# Patient Record
Sex: Female | Born: 1980 | Race: White | Hispanic: No | Marital: Single | Smoking: Current every day smoker
Health system: Southern US, Community
[De-identification: ages and names within clinical notes are randomized; demographics above are authoritative.]

## PROBLEM LIST (undated history)

## (undated) DIAGNOSIS — N289 Disorder of kidney and ureter, unspecified: Secondary | ICD-10-CM

## (undated) DIAGNOSIS — B977 Papillomavirus as the cause of diseases classified elsewhere: Secondary | ICD-10-CM

---

## 2008-11-08 ENCOUNTER — Emergency Department (HOSPITAL_COMMUNITY): Admission: EM | Admit: 2008-11-08 | Discharge: 2008-11-08 | Payer: Self-pay | Admitting: Emergency Medicine

## 2008-11-29 ENCOUNTER — Other Ambulatory Visit: Admission: RE | Admit: 2008-11-29 | Discharge: 2008-11-29 | Payer: Self-pay | Admitting: Obstetrics and Gynecology

## 2009-04-08 ENCOUNTER — Encounter (HOSPITAL_COMMUNITY): Admission: RE | Admit: 2009-04-08 | Discharge: 2009-05-08 | Payer: Self-pay | Admitting: Obstetrics and Gynecology

## 2009-05-22 ENCOUNTER — Inpatient Hospital Stay (HOSPITAL_COMMUNITY): Admission: AD | Admit: 2009-05-22 | Discharge: 2009-05-22 | Payer: Self-pay | Admitting: Obstetrics & Gynecology

## 2009-05-22 ENCOUNTER — Ambulatory Visit: Payer: Self-pay | Admitting: Obstetrics and Gynecology

## 2009-05-28 ENCOUNTER — Inpatient Hospital Stay (HOSPITAL_COMMUNITY): Admission: AD | Admit: 2009-05-28 | Discharge: 2009-05-29 | Payer: Self-pay | Admitting: Obstetrics & Gynecology

## 2009-06-01 ENCOUNTER — Ambulatory Visit: Payer: Self-pay | Admitting: Obstetrics & Gynecology

## 2009-06-01 ENCOUNTER — Inpatient Hospital Stay (HOSPITAL_COMMUNITY): Admission: AD | Admit: 2009-06-01 | Discharge: 2009-06-04 | Payer: Self-pay | Admitting: Obstetrics & Gynecology

## 2009-06-01 HISTORY — PX: TUBAL LIGATION: SHX77

## 2009-12-28 ENCOUNTER — Other Ambulatory Visit (HOSPITAL_COMMUNITY): Payer: Self-pay | Admitting: Emergency Medicine

## 2009-12-28 ENCOUNTER — Other Ambulatory Visit (HOSPITAL_COMMUNITY): Payer: Self-pay | Admitting: Psychiatry

## 2009-12-28 ENCOUNTER — Ambulatory Visit: Payer: Self-pay | Admitting: Psychiatry

## 2009-12-28 ENCOUNTER — Inpatient Hospital Stay (HOSPITAL_COMMUNITY): Admission: AD | Admit: 2009-12-28 | Discharge: 2010-01-04 | Payer: Self-pay | Admitting: Psychiatry

## 2010-02-03 ENCOUNTER — Emergency Department (HOSPITAL_COMMUNITY): Admission: EM | Admit: 2010-02-03 | Discharge: 2010-02-03 | Payer: Self-pay | Admitting: Emergency Medicine

## 2011-03-11 LAB — URINALYSIS, ROUTINE W REFLEX MICROSCOPIC
Glucose, UA: NEGATIVE mg/dL
Nitrite: NEGATIVE
Protein, ur: NEGATIVE mg/dL
Specific Gravity, Urine: 1.021 (ref 1.005–1.030)
pH: 6.5 (ref 5.0–8.0)

## 2011-03-11 LAB — ETHANOL: Alcohol, Ethyl (B): <5 mg/dL (ref 0–10)

## 2011-03-11 LAB — RAPID URINE DRUG SCREEN, HOSP PERFORMED
Amphetamines: NOT DETECTED
Barbiturates: NOT DETECTED
Benzodiazepines: POSITIVE — AB
Tetrahydrocannabinol: NOT DETECTED

## 2011-03-11 LAB — POCT I-STAT, CHEM 8
Calcium, Ion: 1.21 mmol/L (ref 1.12–1.32)
Chloride: 107 mEq/L (ref 96–112)
Creatinine, Ser: 0.8 mg/dL (ref 0.4–1.2)

## 2011-03-11 LAB — DIFFERENTIAL
Basophils Absolute: 0 10*3/uL (ref 0.0–0.1)
Eosinophils Relative: 1 % (ref 0–5)
Lymphs Abs: 1.7 10*3/uL (ref 0.7–4.0)
Monocytes Absolute: 0.5 10*3/uL (ref 0.1–1.0)
Monocytes Relative: 6 % (ref 3–12)
Neutro Abs: 6 10*3/uL (ref 1.7–7.7)
Neutrophils Relative %: 72 % (ref 43–77)

## 2011-03-11 LAB — CBC
Platelets: 266 10*3/uL (ref 150–400)
RBC: 4.52 MIL/uL (ref 3.87–5.11)
RDW: 16.2 % — ABNORMAL HIGH (ref 11.5–15.5)

## 2011-03-11 LAB — SALICYLATE LEVEL: Salicylate Lvl: <4 mg/dL (ref 2.8–20.0)

## 2011-03-14 LAB — CBC
Hemoglobin: 12.6 g/dL (ref 12.0–15.0)
MCHC: 34.1 g/dL (ref 30.0–36.0)

## 2011-03-14 LAB — COMPREHENSIVE METABOLIC PANEL
ALT: 29 U/L (ref 0–35)
Albumin: 4.1 g/dL (ref 3.5–5.2)
Alkaline Phosphatase: 58 U/L (ref 39–117)
CO2: 24 mEq/L (ref 19–32)
Creatinine, Ser: 0.62 mg/dL (ref 0.4–1.2)
GFR calc non Af Amer: >60 mL/min (ref >60)
Glucose, Bld: 83 mg/dL (ref 70–99)

## 2011-03-14 LAB — URINALYSIS, ROUTINE W REFLEX MICROSCOPIC
Bilirubin Urine: NEGATIVE
Ketones, ur: NEGATIVE mg/dL
Leukocytes, UA: NEGATIVE
Specific Gravity, Urine: <1.005 — ABNORMAL LOW (ref 1.005–1.030)

## 2011-03-14 LAB — RAPID URINE DRUG SCREEN, HOSP PERFORMED
Amphetamines: NOT DETECTED
Barbiturates: NOT DETECTED
Benzodiazepines: POSITIVE — AB
Cocaine: NOT DETECTED
Opiates: NOT DETECTED

## 2011-03-14 LAB — URINE MICROSCOPIC-ADD ON

## 2011-03-14 LAB — ETHANOL: Alcohol, Ethyl (B): <5 mg/dL (ref 0–10)

## 2011-04-02 LAB — RPR: RPR Ser Ql: NONREACTIVE

## 2011-04-02 LAB — CBC
HCT: 26.3 % — ABNORMAL LOW (ref 36.0–46.0)
HCT: 35.3 % — ABNORMAL LOW (ref 36.0–46.0)
MCHC: 34.7 g/dL (ref 30.0–36.0)
MCV: 87.4 fL (ref 78.0–100.0)
Platelets: 182 10*3/uL (ref 150–400)
RBC: 2.97 MIL/uL — ABNORMAL LOW (ref 3.87–5.11)
RDW: 13.8 % (ref 11.5–15.5)
WBC: 15.6 10*3/uL — ABNORMAL HIGH (ref 4.0–10.5)
WBC: 16.1 10*3/uL — ABNORMAL HIGH (ref 4.0–10.5)

## 2011-04-02 LAB — COMPREHENSIVE METABOLIC PANEL
ALT: 16 U/L (ref 0–35)
AST: 29 U/L (ref 0–37)
Albumin: 2.8 g/dL — ABNORMAL LOW (ref 3.5–5.2)
Alkaline Phosphatase: 231 U/L — ABNORMAL HIGH (ref 39–117)
CO2: 20 mEq/L (ref 19–32)
Chloride: 109 mEq/L (ref 96–112)
Creatinine, Ser: 0.57 mg/dL (ref 0.4–1.2)
GFR calc non Af Amer: >60 mL/min (ref >60)
Potassium: 3.9 mEq/L (ref 3.5–5.1)
Sodium: 136 mEq/L (ref 135–145)
Total Protein: 5.6 g/dL — ABNORMAL LOW (ref 6.0–8.3)

## 2011-04-02 LAB — URINALYSIS, ROUTINE W REFLEX MICROSCOPIC
Glucose, UA: NEGATIVE mg/dL
Ketones, ur: 15 mg/dL — AB
Nitrite: NEGATIVE
Protein, ur: NEGATIVE mg/dL
Specific Gravity, Urine: 1.015 (ref 1.005–1.030)
Urobilinogen, UA: 0.2 mg/dL (ref 0.0–1.0)

## 2011-04-09 ENCOUNTER — Ambulatory Visit: Payer: Self-pay | Admitting: Physical Therapy

## 2011-05-08 NOTE — Op Note (Signed)
Michaela Schneider, Michaela Schneider               ACCOUNT NO.:  [id number]   MEDICAL RECORD NO.:  [id number]          PATIENT TYPE:  INP   LOCATION:  9106                          [facility]ILITY:  [facility]   PHYSICIAN:  [name], MD       DATE OF BIRTH:  1981-06-13   DATE OF PROCEDURE:  06/01/2009  DATE OF DISCHARGE:                               OPERATIVE REPORT   PROCEDURE:  Repeat low-transverse cesarean section with bilateral tubal  ligation.   PREOPERATIVE DIAGNOSES:  1. Intrauterine pregnancy [redacted] weeks gestation in labor.  2. Previous cesarean section.  3. Undesired fertility.   POSTOPERATIVE DIAGNOSES:  1. Intrauterine pregnancy delivered.  2. Permanent sterilization.   SURGEON:  [name], MD   ANESTHESIA:  Spinal.   ASSISTANT:  Cat Ta, MD   ESTIMATED BLOOD LOSS:  600 mL.   SPECIMEN:  None.   COMPLICATIONS:  None.   DRAINS:  Foley catheter.   COUNTS:  Correct.   FINDINGS:  Live born female infant 8 pounds 12 ounces.  Apgars 8 and 9.  The patient gave consent for repeat cesarean section, bilateral tubal  ligation at 37 weeks fixation gestation and labor.  The patient  identification was confirmed.  She was brought to the operating room and  spinal anesthesia was induced.  She was placed in dorsal supine position  left lateral tilt.  Abdomen and perineum was sterilely prepped and  draped and a Foley catheter was placed.  A #10 blade was used to make  Pfannenstiel incision and was carried down previous cesarean section  scars.  Incision was carried out to the fascia and the fascia was  incised and the incision was extended transversely with curved Mayo  scissors.  Fascia was separated from underlying tissue attachments with  blunt and sharp dissection.  Bellies of the rectus muscles was  separated.  Peritoneum was elevated, incised with Metzenbaum scissors  and the incision was extended vertically.  Bladder blade was inserted.  Vesicouterine fold was incised and the incision was  carried transversely  and bladder flap was created.  Bladder blade was reinserted.  Lower  uterine segment was entered at the midline with #10 blade.  Meconium-  stained fluid was expressed.  Incision was extended transversely and  fetal head was elevated and delivered.  Mouth and nose were cleared with  bulb suction.  The infant was delivered atraumatically.  That was  vigorous at birth and was live born female 8 pounds 12 ounces, Apgars 8  and 9.  Cord was clamped and cut and the infant was handed to nursery  personnel.  The placenta was removed manually and uterine cavity was  explored.  The patient received IV Pitocin.  She had received IV Kefzol  prior to incision.  The uterine incision was closed with a running  locking suture of 0 Vicryl.  A imbricating layer followed with 0 Vicryl  and good hemostasis was seen.  Pelvic gutters were irrigated.  The left  and right fallopian tubes and ovaries were identified and appeared  normal.  Right fallopian tube was elevated and the  Filshie clip was  placed just below the midportion of the tube with good positioning seen.  The same was done on the left side.  The anterior peritoneum was closed  with a running suture of 2-0 Vicryl.  The fascia was closed with a  running suture of 0 Vicryl.  Good hemostasis was assured in the incision  and the incision was irrigated.  Skin was closed with a running  subcuticular suture with 4-0 Vicryl.  Sterile dressing was applied.  The  patient tolerated the procedure well without complication.  Estimated  blood loss 600 mL.  She was brought in stable condition to recovery  room.      [name], MD  Electronically Signed     JA/MEDQ  D:  06/01/2009  T:  06/02/2009  Job:  [id number]

## 2011-05-08 NOTE — Discharge Summary (Signed)
Michaela Schneider, Michaela Schneider               ACCOUNT NO.:  [id number]   MEDICAL RECORD NO.:  [id number]          PATIENT TYPE:  INP   LOCATION:  9106                          [facility]   PHYSICIAN:  Lesly Dukes, M.D. DATE OF BIRTH:  June 19, 1981   DATE OF ADMISSION:  06/01/2009  DATE OF DISCHARGE:  06/04/2009                               DISCHARGE SUMMARY   Michaela Schneider is a gravida 4, para 1-1-1-2, who was admitted in labor at 40  1/[redacted] weeks gestation for a repeat low transverse cesarean section.   ADMITTING DIAGNOSES:  1. Intrauterine pregnancy at 37 weeks.  2. Previous cesarean section x2.  3. Undesired fertility.   DISCHARGE DIAGNOSES:  1. Intrauterine pregnancy at 37 weeks.  2. Previous cesarean section x2.  3. Undesired fertility.   PROCEDURES:  1. Repeat low transverse cesarean section.  2. Bilateral tubal ligation.   HOSPITAL COURSE:  Michaela Schneider is a 30 year old gravida 4, para 1-1-1-2,  who was admitted in labor at 24 1/2 weeks' gestation with desire for  repeat cesarean section.  The patient's OB care has been followed by the  [facility] and has been remarkable for;  1. Prior cesarean section x2.  2. History of preeclampsia.  3. Undesired fertility.   [name], MD, was the surgeon with [name], MD as first assistant.   Infant was a viable female, weight of 8 pounds 12 ounces, Apgars of 8 and  9.  He was taken to the Full-Term Nursery in good condition.  The  patient tolerated the remainder of the cesarean section and the tubal  ligation well, and was taken to the recovery room.  By postop day #1,  Michaela Schneider was feeling well.  Vital signs were stable.  Michaela Schneider was bottle feeding.  Hemoglobin was 9.2 which had been 12 prior to the cesarean section.  Michaela Schneider  desired circumcision for the infant as an outpatient.  By postop day #2,  Michaela Schneider continued to do well.  By postop day #3, vital signs were stable.  Michaela Schneider was feeling well.  Michaela Schneider was deemed to have received the full benefit  of her hospital stay and Michaela Schneider was discharged home or to possibly room in  with the baby as the patient due to pediatrician wanting to observe the  baby for tachypnea.   DISCHARGE INSTRUCTIONS:  Per postpartum handout.   DISCHARGE MEDICATIONS:  1. Motrin 600 mg 1 p.o. q.6 h. p.r.n. pain.  2. Tylox 1-2 p.o. q.3-4 h. p.r.n. pain.  3. Iron 1 p.o. b.i.d.   DISCHARGE FOLLOWUP:  Will occur at [facility] in 6 weeks or as needed.       Cam Hai, C.N.M.      Lesly Dukes, M.D.  Electronically Signed    KS/MEDQ  D:  06/04/2009  T:  06/04/2009  Job:  [id number]

## 2011-09-17 ENCOUNTER — Encounter: Payer: Self-pay | Admitting: Obstetrics & Gynecology

## 2011-09-26 LAB — URINALYSIS, ROUTINE W REFLEX MICROSCOPIC
Glucose, UA: NEGATIVE
Glucose, UA: NEGATIVE
Leukocytes, UA: NEGATIVE
Leukocytes, UA: NEGATIVE
Nitrite: NEGATIVE
Protein, ur: NEGATIVE
Specific Gravity, Urine: 1.008
Specific Gravity, Urine: 1.028
pH: 5.5
pH: 6.5

## 2011-09-26 LAB — URINE MICROSCOPIC-ADD ON

## 2011-09-26 LAB — POCT I-STAT, CHEM 8
Calcium, Ion: 1.22
Creatinine, Ser: 0.7
Glucose, Bld: 94
Hemoglobin: 11.9 — ABNORMAL LOW
TCO2: 22

## 2012-02-29 ENCOUNTER — Emergency Department (HOSPITAL_COMMUNITY): Payer: Self-pay

## 2012-02-29 ENCOUNTER — Emergency Department (HOSPITAL_COMMUNITY)
Admission: EM | Admit: 2012-02-29 | Discharge: 2012-02-29 | Disposition: A | Discharge status: Home / Self Care | Payer: Self-pay | Attending: Emergency Medicine | Admitting: Emergency Medicine

## 2012-02-29 ENCOUNTER — Encounter (HOSPITAL_COMMUNITY): Payer: Self-pay | Admitting: Emergency Medicine

## 2012-02-29 DIAGNOSIS — F17200 Nicotine dependence, unspecified, uncomplicated: Secondary | ICD-10-CM | POA: Insufficient documentation

## 2012-02-29 DIAGNOSIS — Z79899 Other long term (current) drug therapy: Secondary | ICD-10-CM | POA: Insufficient documentation

## 2012-02-29 DIAGNOSIS — R10819 Abdominal tenderness, unspecified site: Secondary | ICD-10-CM | POA: Insufficient documentation

## 2012-02-29 DIAGNOSIS — R1032 Left lower quadrant pain: Secondary | ICD-10-CM | POA: Insufficient documentation

## 2012-02-29 HISTORY — DX: Disorder of kidney and ureter, unspecified: N28.9

## 2012-02-29 HISTORY — DX: Papillomavirus as the cause of diseases classified elsewhere: B97.7

## 2012-02-29 LAB — COMPREHENSIVE METABOLIC PANEL
ALT: 11 U/L (ref 0–35)
BUN: 12 mg/dL (ref 6–23)
Calcium: 9.6 mg/dL (ref 8.4–10.5)
GFR calc Af Amer: >90 mL/min (ref >90)
Glucose, Bld: 109 mg/dL — ABNORMAL HIGH (ref 70–99)
Sodium: 139 mEq/L (ref 135–145)
Total Protein: 6.9 g/dL (ref 6.0–8.3)

## 2012-02-29 LAB — URINALYSIS, ROUTINE W REFLEX MICROSCOPIC
Glucose, UA: NEGATIVE mg/dL
Ketones, ur: NEGATIVE mg/dL
Leukocytes, UA: NEGATIVE
Protein, ur: NEGATIVE mg/dL

## 2012-02-29 LAB — CBC
Hemoglobin: 12.1 g/dL (ref 12.0–15.0)
MCH: 29 pg (ref 26.0–34.0)
MCHC: 33.8 g/dL (ref 30.0–36.0)

## 2012-02-29 LAB — WET PREP, GENITAL

## 2012-02-29 MED ORDER — KETOROLAC TROMETHAMINE 30 MG/ML IJ SOLN
30.0000 mg | Freq: Once | INTRAMUSCULAR | Status: AC
  Administered 2012-02-29: 30 mg via INTRAVENOUS
  Filled 2012-02-29: qty 1

## 2012-02-29 MED ORDER — SODIUM CHLORIDE 0.9 % IV SOLN
Freq: Once | INTRAVENOUS | Status: AC
  Administered 2012-02-29: 20 mL/h via INTRAVENOUS

## 2012-02-29 MED ORDER — HYDROMORPHONE HCL PF 1 MG/ML IJ SOLN
1.0000 mg | Freq: Once | INTRAMUSCULAR | Status: AC
  Administered 2012-02-29: 1 mg via INTRAVENOUS
  Filled 2012-02-29: qty 1

## 2012-02-29 MED ORDER — NAPROXEN 500 MG PO TABS: 500.0000 mg | ORAL_TABLET | Freq: Two times a day (BID) | ORAL | Status: AC

## 2012-02-29 MED ORDER — HYDROCODONE-ACETAMINOPHEN 5-325 MG PO TABS: 1.0000 | ORAL_TABLET | Freq: Four times a day (QID) | ORAL | Status: DC | PRN

## 2012-02-29 NOTE — ED Notes (Signed)
Labs drawn with IV start.

## 2012-02-29 NOTE — ED Provider Notes (Signed)
History     CSN: [id number]  Arrival date & time 02/29/12  0139   First MD Initiated Contact with Patient 02/29/12 0210      Chief Complaint  Patient presents with  . Abdominal Cramping    back pain    (Consider location/radiation/quality/duration/timing/severity/associated sxs/prior treatment) HPI Comments: Pt has 2.5 years of lower abd pain - has had worsening pain the last several days - but denies assocaited f/c/n/v.  Sx are similar in quality to the pain she has had chronically - she reports an ultrasound in the last 6 months showing a endometrio... ? And tumor in the pelvis which she states was told to her was not malignant.  She has no dysuria but has vaginal bleeding 3 weeks out of the month consistely - has had BTL.  Sx are persistent, crampy, lower left abdomen.  Worse with palpation and associated with some swelling of the abdomen.  Patient is a 31 y.o. female presenting with cramps. The history is provided by the patient.  Abdominal Cramping    Past Medical History  Diagnosis Date  . Renal disorder     Kidney stone x 1  multi- Kidney infections    Past Surgical History  Procedure Date  . Cesarean section     three total  . Tubal ligation 06-01-2009    done with last C-section    No family history on file.  History  Substance Use Topics  . Smoking status: Current Everyday Smoker -- 1.0 packs/day for 14 years    Types: Cigarettes  . Smokeless tobacco: Not on file  . Alcohol Use: Yes     if a special occasion    OB History    Grav Para Term Preterm Abortions TAB SAB Ect Mult Living   4 3 3  1            Review of Systems  All other systems reviewed and are negative.    Allergies  Penicillins; Sulfa antibiotics; Ultram; and Erythromycin  Home Medications   Current Outpatient Rx  Name Route Sig Dispense Refill  . DIAZEPAM 10 MG PO TABS Oral Take 10 mg by mouth every 6 (six) hours as needed.    Marland Kitchen NAPROXEN 500 MG PO TABS Oral Take 500 mg by mouth 2  (two) times daily with a meal.    . OXYCODONE-ACETAMINOPHEN 10-650 MG PO TABS Oral Take 1 tablet by mouth every 6 (six) hours as needed.    Marland Kitchen CARISOPRODOL 350 MG PO TABS Oral Take 350 mg by mouth 4 (four) times daily as needed.      BP 110/58  Pulse 104  Temp(Src) 98.4 F (36.9 C) (Oral)  Resp 18  SpO2 98%  LMP 02/06/2012  Physical Exam  Nursing note and vitals reviewed. Constitutional: She appears well-developed and well-nourished. No distress.  HENT:  Head: Normocephalic and atraumatic.  Mouth/Throat: Oropharynx is clear and moist. No oropharyngeal exudate.  Eyes: Conjunctivae and EOM are normal. Pupils are equal, round, and reactive to light. Right eye exhibits no discharge. Left eye exhibits no discharge. No scleral icterus.  Neck: Normal range of motion. Neck supple. No JVD present. No thyromegaly present.  Cardiovascular: Normal rate, regular rhythm, normal heart sounds and intact distal pulses.  Exam reveals no gallop and no friction rub.   No murmur heard. Pulmonary/Chest: Effort normal and breath sounds normal. No respiratory distress. She has no wheezes. She has no rales.  Abdominal: Soft. Bowel sounds are normal. She exhibits no distension and  no mass. There is tenderness ( ttp in the LLQ and mild ttp in the RUQ - no tympanitic sounds to percussion, no guarding, no rebound, non peritoneal). There is no rebound and no guarding.  Genitourinary:       Chaperone present for entire pelvic exam. No cervical motion tenderness, no right adnexal tenderness, left adnexal tenderness present, no masses felt, cervical os is closed, no discharge or bleeding present, normal external genitalia  Musculoskeletal: Normal range of motion. She exhibits no edema and no tenderness.  Lymphadenopathy:    She has no cervical adenopathy.  Neurological: She is alert. Coordination normal.  Skin: Skin is warm and dry. No rash noted. No erythema.  Psychiatric: She has a normal mood and affect. Her  behavior is normal.    ED Course  Procedures (including critical care time)   Labs Reviewed  URINALYSIS, ROUTINE W REFLEX MICROSCOPIC  PREGNANCY, URINE  CBC  COMPREHENSIVE METABOLIC PANEL   No results found.   No diagnosis found.    MDM  Soft abdomen with chronic pain - similar in quality - slightly increased intensigy - no pain at [facility] point - no [place] at this time of night - check labs, UA and pelvic - anticipate [place] in the AM, story and physical c/w endometriosis.  IV toradol ordered      Change of shift - care signed out to Dr. Candie Chroman, MD 02/29/12 [phone]

## 2012-02-29 NOTE — Discharge Instructions (Signed)
Pelvic ultrasound is negative Dr. Hyacinth Meeker initially concerned about endometriosis will need to followup with OB/GYN take Naprosyn as directed supplement with hydrocodone as needed for more severe pain.

## 2012-02-29 NOTE — ED Notes (Signed)
Sleeping in room.  Has problem with transportation, no one here at present, lives near [place], and is concerned she will not be able to return at 07:00am for ultrasound.

## 2012-03-01 LAB — GC/CHLAMYDIA PROBE AMP, GENITAL
Chlamydia, DNA Probe: NEGATIVE
GC Probe Amp, Genital: NEGATIVE

## 2012-03-11 ENCOUNTER — Emergency Department (HOSPITAL_BASED_OUTPATIENT_CLINIC_OR_DEPARTMENT_OTHER)
Admission: EM | Admit: 2012-03-11 | Discharge: 2012-03-11 | Disposition: A | Discharge status: Home / Self Care | Payer: Self-pay | Attending: Emergency Medicine | Admitting: Emergency Medicine

## 2012-03-11 ENCOUNTER — Encounter (HOSPITAL_BASED_OUTPATIENT_CLINIC_OR_DEPARTMENT_OTHER): Payer: Self-pay | Admitting: Emergency Medicine

## 2012-03-11 DIAGNOSIS — M545 Low back pain, unspecified: Secondary | ICD-10-CM | POA: Insufficient documentation

## 2012-03-11 DIAGNOSIS — Z882 Allergy status to sulfonamides status: Secondary | ICD-10-CM | POA: Insufficient documentation

## 2012-03-11 DIAGNOSIS — F17200 Nicotine dependence, unspecified, uncomplicated: Secondary | ICD-10-CM | POA: Insufficient documentation

## 2012-03-11 DIAGNOSIS — M549 Dorsalgia, unspecified: Secondary | ICD-10-CM

## 2012-03-11 DIAGNOSIS — Z880 Allergy status to penicillin: Secondary | ICD-10-CM | POA: Insufficient documentation

## 2012-03-11 DIAGNOSIS — Z87442 Personal history of urinary calculi: Secondary | ICD-10-CM | POA: Insufficient documentation

## 2012-03-11 MED ORDER — KETOROLAC TROMETHAMINE 60 MG/2ML IM SOLN
60.0000 mg | Freq: Once | INTRAMUSCULAR | Status: AC
  Administered 2012-03-11: 60 mg via INTRAMUSCULAR
  Filled 2012-03-11: qty 2

## 2012-03-11 MED ORDER — PREDNISONE 10 MG PO TABS: ORAL_TABLET | ORAL | Status: DC

## 2012-03-11 MED ORDER — HYDROCODONE-ACETAMINOPHEN 5-325 MG PO TABS: 2.0000 | ORAL_TABLET | ORAL | Status: AC | PRN

## 2012-03-11 NOTE — Discharge Instructions (Signed)
Back Pain, Adult Low back pain is very common. About 1 in 5 people have back pain.The cause of low back pain is rarely dangerous. The pain often gets better over time.About half of people with a sudden onset of back pain feel better in just 2 weeks. About 8 in 10 people feel better by 6 weeks.  CAUSES Some common causes of back pain include:  Strain of the muscles or ligaments supporting the spine.   Wear and tear (degeneration) of the spinal discs.   Arthritis.   Direct injury to the back.  DIAGNOSIS Most of the time, the direct cause of low back pain is not known.However, back pain can be treated effectively even when the exact cause of the pain is unknown.Answering your caregiver's questions about your overall health and symptoms is one of the most accurate ways to make sure the cause of your pain is not dangerous. If your caregiver needs more information, he or she may order lab work or imaging tests (X-rays or MRIs).However, even if imaging tests show changes in your back, this usually does not require surgery. HOME CARE INSTRUCTIONS For many people, back pain returns.Since low back pain is rarely dangerous, it is often a condition that people can learn to manageon their own.   Remain active. It is stressful on the back to sit or stand in one place. Do not sit, drive, or stand in one place for more than 30 minutes at a time. Take short walks on level surfaces as soon as pain allows.Try to increase the length of time you walk each day.   Do not stay in bed.Resting more than 1 or 2 days can delay your recovery.   Do not avoid exercise or work.Your body is made to move.It is not dangerous to be active, even though your back may hurt.Your back will likely heal faster if you return to being active before your pain is gone.   Pay attention to your body when you bend and lift. Many people have less discomfortwhen lifting if they bend their knees, keep the load close to their  bodies,and avoid twisting. Often, the most comfortable positions are those that put less stress on your recovering back.   Find a comfortable position to sleep. Use a firm mattress and lie on your side with your knees slightly bent. If you lie on your back, put a pillow under your knees.   Only take over-the-counter or prescription medicines as directed by your caregiver. Over-the-counter medicines to reduce pain and inflammation are often the most helpful.Your caregiver may prescribe muscle relaxant drugs.These medicines help dull your pain so you can more quickly return to your normal activities and healthy exercise.   Put ice on the injured area.   Put ice in a plastic bag.   Place a towel between your skin and the bag.   Leave the ice on for 15 to 20 minutes, 3 to 4 times a day for the first 2 to 3 days. After that, ice and heat may be alternated to reduce pain and spasms.   Ask your caregiver about trying back exercises and gentle massage. This may be of some benefit.   Avoid feeling anxious or stressed.Stress increases muscle tension and can worsen back pain.It is important to recognize when you are anxious or stressed and learn ways to manage it.Exercise is a great option.  SEEK MEDICAL CARE IF:  You have pain that is not relieved with rest or medicine.   You have   pain that does not improve in 1 week.   You have new symptoms.   You are generally not feeling well.  SEEK IMMEDIATE MEDICAL CARE IF:   You have pain that radiates from your back into your legs.   You develop new bowel or bladder control problems.   You have unusual weakness or numbness in your arms or legs.   You develop nausea or vomiting.   You develop abdominal pain.   You feel faint.  Document Released: 12/10/2005 Document Revised: 11/29/2011 Document Reviewed: 04/30/2011 Exit[employer] Patient Information 2012 [employer]. 

## 2012-03-11 NOTE — ED Provider Notes (Signed)
History     CSN: [id number]  Arrival date & time 03/11/12  1723   First MD Initiated Contact with Patient 03/11/12 1927      Chief Complaint  Patient presents with  . Back Pain    (Consider location/radiation/quality/duration/timing/severity/associated sxs/prior treatment) Patient is a 31 y.o. female presenting with back pain. The history is provided by the patient. No language interpreter was used.  Back Pain  This is a new problem. The problem occurs constantly. The problem has been gradually worsening. The pain is associated with lifting heavy objects. The pain is present in the lumbar spine and sacro-iliac joint. The quality of the pain is described as stabbing and aching. The pain radiates to the right thigh. The pain is at a severity of 7/10. The pain is moderate. The symptoms are aggravated by bending. The pain is the same all the time. Stiffness is present all day. The treatment provided significant relief.  Pt complains of pain in low back.  Pt reports the pain goes down her leg.  Pt has a history of a bulging disc and sciatica.  Pt reports she has been lifting heavy stuff at work  Past Medical History  Diagnosis Date  . Renal disorder     Kidney stone x 1  multi- Kidney infections  . 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 Cesarean section     three total  . Tubal ligation 06-01-2009    done with last C-section    No family history on file.  History  Substance Use Topics  . Smoking status: Current Everyday Smoker -- 1.0 packs/day for 14 years    Types: Cigarettes  . Smokeless tobacco: Not on file  . Alcohol Use: Yes     if a special occasion    OB History    Grav Para Term Preterm Abortions TAB SAB Ect Mult Living   4 3 3  1            Review of Systems  Musculoskeletal: Positive for back pain.  All other systems reviewed and are  negative.    Allergies  Penicillins; Sulfa antibiotics; Ultram; and Erythromycin  Home Medications   Current Outpatient Rx  Name Route Sig Dispense Refill  . ALBUTEROL SULFATE HFA 108 (90 BASE) MCG/ACT IN AERS Inhalation Inhale 2 puffs into the lungs every 6 (six) hours as needed. For cough    . BC HEADACHE PO Oral Take 2 packets by mouth 2 (two) times daily as needed. For pain    . NAPROXEN 500 MG PO TABS Oral Take 1 tablet (500 mg total) by mouth 2 (two) times daily. 14 tablet 0  . DIAZEPAM 10 MG PO TABS Oral Take 10 mg by mouth every 6 (six) hours as needed. For anxiety.  Took last dose on 03/10/12    . HYDROCODONE-ACETAMINOPHEN 5-325 MG PO TABS Oral Take 1 tablet by mouth 2 (two) times daily as needed. For pain.  Took last dose 03/09/12    . OXYCODONE-ACETAMINOPHEN 10-650 MG PO TABS Oral Take 1 tablet by mouth every 6 (six) hours as needed. For pain. Took last dose 03/10/12      BP 113/83  Pulse 101  Temp(Src) 98 F (36.7 C) (Oral)  Resp 16  SpO2 100%  LMP 03/05/2012  Physical Exam  Nursing note and vitals reviewed. Constitutional: She appears well-developed and well-nourished.  HENT:  Head: Normocephalic and atraumatic.  Eyes: Conjunctivae and EOM are normal. Pupils are equal, round, and reactive to light.  Neck: Normal range of motion. Neck supple.  Cardiovascular: Normal rate.   Pulmonary/Chest: Effort normal.  Abdominal: Soft.  Musculoskeletal: Normal range of motion.       Tender lower lumbar area  Neurological: She is alert.  Skin: Skin is warm.  Psychiatric: She has a normal mood and affect.    ED Course  Procedures (including critical care time)  Labs Reviewed - No data to display No results found.   No diagnosis found.    MDM  Pt given rx for prednisone and hydrocodone.  I advised to follow up with Orthopaedist for recheck in 1 week if pain persist.        Elson Areas, [place] 03/11/12 2009

## 2012-03-11 NOTE — ED Notes (Signed)
Pt c/o lower back pain radiating down right leg. Pt reports new job with a lot of lifting required.

## 2012-03-11 NOTE — ED Notes (Signed)
Pt c/o pain to low back w/ radiation to RLE - sts she thinks it occurred when she was picking up boxes at work

## 2012-03-12 NOTE — ED Provider Notes (Signed)
Medical screening examination/treatment/procedure(s) were performed by non-physician practitioner and as supervising physician I was immediately available for consultation/collaboration.   Forbes Cellar, MD 03/12/12 0020

## 2012-04-29 ENCOUNTER — Encounter (HOSPITAL_COMMUNITY): Payer: Self-pay | Admitting: *Deleted

## 2012-04-29 DIAGNOSIS — F17200 Nicotine dependence, unspecified, uncomplicated: Secondary | ICD-10-CM | POA: Insufficient documentation

## 2012-04-29 DIAGNOSIS — N949 Unspecified condition associated with female genital organs and menstrual cycle: Secondary | ICD-10-CM | POA: Insufficient documentation

## 2012-04-29 DIAGNOSIS — R109 Unspecified abdominal pain: Secondary | ICD-10-CM | POA: Insufficient documentation

## 2012-04-29 NOTE — ED Notes (Signed)
Pt reports rt sided flank pain with suprapubic pain x 2 days, pt reports dysuria tonight

## 2012-04-30 ENCOUNTER — Emergency Department (HOSPITAL_COMMUNITY)
Admission: EM | Admit: 2012-04-30 | Discharge: 2012-04-30 | Disposition: A | Discharge status: Home / Self Care | Payer: Self-pay | Attending: Emergency Medicine | Admitting: Emergency Medicine

## 2012-04-30 DIAGNOSIS — D649 Anemia, unspecified: Secondary | ICD-10-CM

## 2012-04-30 DIAGNOSIS — R109 Unspecified abdominal pain: Secondary | ICD-10-CM

## 2012-04-30 LAB — URINALYSIS, MICROSCOPIC ONLY
Glucose, UA: NEGATIVE mg/dL
Leukocytes, UA: NEGATIVE
pH: 6 (ref 5.0–8.0)

## 2012-04-30 LAB — HEPATIC FUNCTION PANEL
ALT: 8 U/L (ref 0–35)
AST: 12 U/L (ref 0–37)
Total Protein: 5.5 g/dL — ABNORMAL LOW (ref 6.0–8.3)

## 2012-04-30 LAB — CBC
HCT: 31.5 % — ABNORMAL LOW (ref 36.0–46.0)
Hemoglobin: 10.5 g/dL — ABNORMAL LOW (ref 12.0–15.0)
MCH: 28.8 pg (ref 26.0–34.0)
MCHC: 33.3 g/dL (ref 30.0–36.0)

## 2012-04-30 LAB — DIFFERENTIAL
Basophils Relative: 0 % (ref 0–1)
Monocytes Absolute: 0.4 10*3/uL (ref 0.1–1.0)
Monocytes Relative: 5 % (ref 3–12)
Neutro Abs: 4.3 10*3/uL (ref 1.7–7.7)

## 2012-04-30 LAB — POCT I-STAT, CHEM 8
Chloride: 106 mEq/L (ref 96–112)
HCT: 37 % (ref 36.0–46.0)
Potassium: 4.7 mEq/L (ref 3.5–5.1)
Sodium: 141 mEq/L (ref 135–145)

## 2012-04-30 LAB — POCT PREGNANCY, URINE: Preg Test, Ur: NEGATIVE

## 2012-04-30 MED ORDER — CARISOPRODOL 350 MG PO TABS: 350.0000 mg | ORAL_TABLET | Freq: Three times a day (TID) | ORAL | Status: DC

## 2012-04-30 MED ORDER — KETOROLAC TROMETHAMINE 30 MG/ML IJ SOLN
30.0000 mg | Freq: Once | INTRAMUSCULAR | Status: AC
  Administered 2012-04-30: 30 mg via INTRAVENOUS
  Filled 2012-04-30: qty 1

## 2012-04-30 MED ORDER — NAPROXEN 500 MG PO TABS: 500.0000 mg | ORAL_TABLET | Freq: Two times a day (BID) | ORAL | Status: AC

## 2012-04-30 MED ORDER — OXYCODONE-ACETAMINOPHEN 5-325 MG PO TABS: 1.0000 | ORAL_TABLET | ORAL | Status: DC | PRN

## 2012-04-30 MED ORDER — SODIUM CHLORIDE 0.9 % IV BOLUS (SEPSIS)
1000.0000 mL | Freq: Once | INTRAVENOUS | Status: AC
  Administered 2012-04-30: 1000 mL via INTRAVENOUS

## 2012-04-30 MED ORDER — CARISOPRODOL 350 MG PO TABS: 350.0000 mg | ORAL_TABLET | Freq: Three times a day (TID) | ORAL | Status: AC

## 2012-04-30 MED ORDER — HYDROMORPHONE HCL PF 1 MG/ML IJ SOLN
1.0000 mg | Freq: Once | INTRAMUSCULAR | Status: AC
  Administered 2012-04-30: 1 mg via INTRAVENOUS
  Filled 2012-04-30: qty 1

## 2012-04-30 MED ORDER — OXYCODONE-ACETAMINOPHEN 5-325 MG PO TABS
2.0000 | ORAL_TABLET | Freq: Once | ORAL | Status: AC
  Administered 2012-04-30: 2 via ORAL
  Filled 2012-04-30: qty 2

## 2012-04-30 MED ORDER — OXYCODONE-ACETAMINOPHEN 5-325 MG PO TABS: 1.0000 | ORAL_TABLET | ORAL | Status: AC | PRN

## 2012-04-30 MED ORDER — ONDANSETRON HCL 4 MG/2ML IJ SOLN
4.0000 mg | Freq: Once | INTRAMUSCULAR | Status: AC
  Administered 2012-04-30: 4 mg via INTRAVENOUS
  Filled 2012-04-30: qty 2

## 2012-04-30 NOTE — Discharge Instructions (Signed)
You have been diagnosed with undifferentiated abdominal pain.  Abdominal pain can be caused by many things. Your caregiver evaluates the seriousness of your pain by an examination and possibly blood or urine tests and imaging (CT scan, x-rays, ultrasound). Many cases can be observed and treated at home after initial evaluation in the emergency department. Even though you are being discharged home, abdominal pain can be unpredictable. Therefore, you need a repeat exam if your pain does not resolve, returns, or worsens. Most patient's with abdominal pain do not need to be admitted to the hospital or have surgery, but serious problems like appendicitis and gallbladder attacks can start out as nonspecific pain. Many abdominal conditions cannot be diagnosed in 1 visit, so followup evaluations are very important.  Seek immediate medical attention if:  *The pain does not go away or becomes severe. *Temperature above 101 develops *Repeated vomiting occurs(multiple episodes) *The pain becomes localized to portions of the abdomen. The right side could possibly be appendicitis. In an adult, the left lower portion of the abdomen could be colitis or diverticulitis. *Blood is being passed in stools or vomit *Return also if you develop chest pain, difficulty breathing, dizziness or fainting, or become confused poorly responsive or inconsolable (young children).     If you do not have a physician, you should reference the below phone numbers and call in the morning to establish follow up care.  RESOURCE GUIDE  Dental Problems  Patients with Medicaid: [facility]                     [facility] [address]                                           [address] Phone:  [phone]                                                  Phone:  [phone]  If unable to pay or uninsured, contact:  [facility] or [facility] to become qualified for the adult dental  clinic.  Chronic Pain Problems Contact [facility]  [phone] Patients need to be referred by their primary care doctor.  Insufficient Money for Medicine Contact [employer]:  call "211" or [facility] 271-5999.  No Primary Care Doctor Call [facility]  832-8000 Other agencies that provide inexpensive medical care    [facility]  [phone]     Internal Medicine  [phone]    [facility]  [phone]    Women's Clinic  [phone]    [facility]  [phone]    [facility]  [phone]  Psychological Services [facility]  [phone] [facility]  [phone] [facility]   800 853-5163 (emergency services 641-4993)  Substance Abuse Resources [facility]  336-882-2125 [facility] 336-784-9470 [facility] 336-285-9073 [facility] 336-845-3988 Residential & Outpatient Substance Abuse Program  800-659-3381  Abuse/Neglect [place] Child Abuse Hotline (336) 641-3795 [place] Child Abuse Hotline 800-378-5315 (After Hours)  Emergency Shelter [facility] (336) 271-5985  [facility] (336) 275-9566 [facility]   Services (704) 372-4663  MRSA Hotline #:   832-7006    [place] Resources  [facility] [place]     [employer]                          [facility] [address] [place]                       [address]      [address]  [place]                                                [place]                            [place] Phone:  [phone]                                   Phone:  [phone]                 Phone:  [phone]  [facility] Phone:  [phone]  [place] Child Abuse Hotline (336) 342-1394 (336) 342-3537 (After Hours)    

## 2012-04-30 NOTE — ED Notes (Signed)
Patient states she went to get up to use the bathroom and got hot and her pain started back. MD notified.

## 2012-04-30 NOTE — ED Notes (Signed)
Resting sitting in bed. Pain 4\10 at this time. Denies nausea. Call bell within reach. No distress at this time. Family at bedside. Will continue to monitor.

## 2012-04-30 NOTE — ED Notes (Signed)
Patient c/o dry mouth after getting Dilaudid for pain.

## 2012-04-30 NOTE — ED Notes (Signed)
Patient states she is having an increase in pain. MD is aware.

## 2012-04-30 NOTE — ED Notes (Signed)
md at bedside to evaluate

## 2012-04-30 NOTE — ED Notes (Signed)
Lab at bedside to draw blood.

## 2012-04-30 NOTE — ED Notes (Signed)
Given ice chips per request. Denies any other needs at this time.

## 2012-04-30 NOTE — ED Notes (Signed)
Into room to attempt iv access. 

## 2012-04-30 NOTE — ED Notes (Signed)
Started x 2 days ago. Right flank pain that radiates around to suprapubic area. Having dysuria at times, while other times she is having frequency. Burning on urination at times. Sharp pain that is constant. Nothing makes it better or worse. Active bowel sounds in all quadrants. Has nausea but no vomiting. States she has a history of kidney stones and it feels the same.

## 2012-04-30 NOTE — ED Provider Notes (Signed)
History     CSN: [id number]  Arrival date & time 04/29/12  2341   First MD Initiated Contact with Patient 04/29/12 2359      Chief Complaint  Patient presents with  . Flank Pain    (Consider location/radiation/quality/duration/timing/severity/associated sxs/prior treatment) HPI Comments: Gradual onset of R flank pain onset yesterday - states it is more or less constant, it does not seem to come in waves, is crampy and achy and sometimes sharp and stabbing - it seems to be associated with lower abdominal pain which she states she has had chronically and for which she has been seen by GYN - thought to have endometriosis and known to have ovarian cysts per pt.  She has also had hx of UTI's and pyelonephritis in the past.  She admits to having subjective f/c and has had nasuea with one episode of vomiting prior to arrival.  Moving makes pain worse.  She feels dehydrated.  Admits to having irregular periods - this is normal for her - s/p BTL - having 2 days of some brown d/c - states was checked at GYN last month for STD - neg and no new partners.  She states she has rare sexual activity due to pain with intercourse (chronic pelvic pain)  Patient is a 31 y.o. female presenting with flank pain. The history is provided by the patient and medical records.  Flank Pain    Past Medical History  Diagnosis Date  . Renal disorder     Kidney stone x 1  multi- Kidney infections  . 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 Cesarean section     three total  . Tubal ligation 06-01-2009    done with last C-section    No family history on file.  History  Substance Use Topics  . Smoking status: Current Everyday Smoker -- 1.0 packs/day for 14 years    Types: Cigarettes  . Smokeless tobacco: Not on file  . Alcohol Use: Yes     if a special occasion    OB History    Grav Para Term Preterm  Abortions TAB SAB Ect Mult Living   4 3 3  1            Review of Systems  Genitourinary: Positive for flank pain.  All other systems reviewed and are negative.    Allergies  Penicillins; Sulfa antibiotics; Ultram; and Erythromycin  Home Medications   Current Outpatient Rx  Name Route Sig Dispense Refill  . ALBUTEROL SULFATE HFA 108 (90 BASE) MCG/ACT IN AERS Inhalation Inhale 2 puffs into the lungs every 6 (six) hours as needed. For cough    . BC HEADACHE PO Oral Take 2 packets by mouth 2 (two) times daily as needed. For pain    . CARISOPRODOL 350 MG PO TABS Oral Take 1 tablet (350 mg total) by mouth 3 (three) times daily. 15 tablet 0  . DIAZEPAM 10 MG PO TABS Oral Take 10 mg by mouth every 6 (six) hours as needed. For anxiety.  Took last dose on 03/10/12    . HYDROCODONE-ACETAMINOPHEN 5-325 MG PO TABS Oral Take 1 tablet by mouth 2 (two) times daily as needed. For pain.  Took last dose 03/09/12    . NAPROXEN 500 MG PO TABS Oral Take 1 tablet (500 mg total) by mouth 2 (two) times daily. 14 tablet 0  .  NAPROXEN 500 MG PO TABS Oral Take 1 tablet (500 mg total) by mouth 2 (two) times daily with a meal. 30 tablet 0  . OXYCODONE-ACETAMINOPHEN 10-650 MG PO TABS Oral Take 1 tablet by mouth every 6 (six) hours as needed. For pain. Took last dose 03/10/12    . OXYCODONE-ACETAMINOPHEN 5-325 MG PO TABS Oral Take 1 tablet by mouth every 4 (four) hours as needed for pain. May take 2 tablets PO q 6 hours for severe pain - Do not take with Tylenol as this tablet already contains tylenol 15 tablet 0  . PREDNISONE 10 MG PO TABS  6,5,4,3,2,1 taper 21 tablet 0    BP 110/63  Pulse 94  Temp(Src) 98 F (36.7 C) (Oral)  Resp 18  Ht 5\' 6"  (1.676 m)  Wt 130 lb (58.968 kg)  BMI 20.98 kg/m2  SpO2 98%  LMP 04/18/2012  Physical Exam  Nursing note and vitals reviewed. Constitutional: She appears well-developed and well-nourished.  HENT:  Head: Normocephalic and atraumatic.  Mouth/Throat: No oropharyngeal  exudate.       MM dehdyrated  Eyes: Conjunctivae and EOM are normal. Pupils are equal, round, and reactive to light. Right eye exhibits no discharge. Left eye exhibits no discharge. No scleral icterus.  Neck: Normal range of motion. Neck supple. No JVD present. No thyromegaly present.  Cardiovascular: Normal rate, regular rhythm, normal heart sounds and intact distal pulses.  Exam reveals no gallop and no friction rub.   No murmur heard. Pulmonary/Chest: Effort normal and breath sounds normal. No respiratory distress. She has no wheezes. She has no rales.  Abdominal: Soft. Bowel sounds are normal. She exhibits no distension and no mass. There is tenderness ( bil lower abd ttp, no focal ttp, non peritoneal and no guarding). There is no rebound and no guarding.       No RUQ ttp and mild CVA ttp on the R.  No CVA ttp on the L  Musculoskeletal: Normal range of motion. She exhibits no edema and no tenderness.  Lymphadenopathy:    She has no cervical adenopathy.  Neurological: She is alert. Coordination normal.  Skin: Skin is warm and dry. No rash noted. No erythema.  Psychiatric: She has a normal mood and affect. Her behavior is normal.    ED Course  Procedures (including critical care time)  Labs Reviewed  HEPATIC FUNCTION PANEL - Abnormal; Notable for the following:    Total Protein 5.5 (*)    Albumin 3.2 (*)    Total Bilirubin 0.1 (*)    All other components within normal limits  CBC - Abnormal; Notable for the following:    RBC 3.65 (*)    Hemoglobin 10.5 (*)    HCT 31.5 (*)    All other components within normal limits  DIFFERENTIAL - Abnormal; Notable for the following:    Lymphocytes Relative 47 (*)    Lymphs Abs 4.4 (*)    All other components within normal limits  URINALYSIS, WITH MICROSCOPIC  POCT PREGNANCY, URINE  POCT I-STAT, CHEM 8   No results found.   1. Flank pain   2. Anemia       MDM  Has some chronic abd pain - much of her sx today are not different but  the flank pain seems to be worse - watching her move in bed - she does favor the R flank and states it hurts to bend and rotate - the urine has not changed color, she has no objective fever or  tachycardia but does appear dehydrated - IVF ordered, zofran, toradol, check renal function and UA.  Repeat evaluation shows that she has a soft abdomen, no right upper quadrant pain, ongoing right tenderness to palpation over the right side. I doubt appendicitis, no other evidence for liver dysfunction, laboratory test revealed no leukocytosis, normal liver function, slight anemia. I have informed the patient and these results. Urinalysis is clean without signs of dehydration or infection or hematuria. No imaging is indicated at this time. She now reports that she has recently come off all of her pain medications including her chronic fibromyalgia pain medications and does not have a family doctor to followup with. She has been given multiple doses of pain medications including Toradol, hydromorphone, Percocet and has improved significantly, will discharge with some of her home medications and referrals for followup.  Discharge Prescriptions include:  #1 soma  #2 Naprosyn  #3 Percocet        Vida Roller, MD 04/30/12 0330

## 2012-04-30 NOTE — ED Notes (Signed)
Pain 4\10 at this time. Denies nausea. Denies needs at this time. Call bell within reach. Family at bedside. Bed in low position and locked with side rails up. Will continue to monitor. Normal saline complete. Iv saline locked.

## 2012-04-30 NOTE — ED Notes (Signed)
Medicated per md order for 9\10 pain and nausea. Denies needs at this time. Call bell within reach. Will continue to monitor.

## 2012-04-30 NOTE — ED Notes (Signed)
Into room to see patient. States pain remains 9\10. Denies nausea. Normal saline infusing well with no signs of infiltration. Call bell within reach. MD aware.

## 2014-10-25 ENCOUNTER — Encounter (HOSPITAL_COMMUNITY): Payer: Self-pay | Admitting: *Deleted

## 2015-02-08 ENCOUNTER — Emergency Department (HOSPITAL_COMMUNITY): Payer: Self-pay

## 2015-02-08 ENCOUNTER — Encounter (HOSPITAL_COMMUNITY): Payer: Self-pay | Admitting: Emergency Medicine

## 2015-02-08 ENCOUNTER — Emergency Department (HOSPITAL_COMMUNITY)
Admission: EM | Admit: 2015-02-08 | Discharge: 2015-02-08 | Disposition: A | Discharge status: Home / Self Care | Payer: Self-pay | Attending: Emergency Medicine | Admitting: Emergency Medicine

## 2015-02-08 DIAGNOSIS — Z21 Asymptomatic human immunodeficiency virus [HIV] infection status: Secondary | ICD-10-CM | POA: Insufficient documentation

## 2015-02-08 DIAGNOSIS — S300XXA Contusion of lower back and pelvis, initial encounter: Secondary | ICD-10-CM

## 2015-02-08 DIAGNOSIS — Y998 Other external cause status: Secondary | ICD-10-CM | POA: Insufficient documentation

## 2015-02-08 DIAGNOSIS — Y9289 Other specified places as the place of occurrence of the external cause: Secondary | ICD-10-CM | POA: Insufficient documentation

## 2015-02-08 DIAGNOSIS — S8001XA Contusion of right knee, initial encounter: Secondary | ICD-10-CM

## 2015-02-08 DIAGNOSIS — W009XXA Unspecified fall due to ice and snow, initial encounter: Secondary | ICD-10-CM | POA: Insufficient documentation

## 2015-02-08 DIAGNOSIS — W19XXXA Unspecified fall, initial encounter: Secondary | ICD-10-CM

## 2015-02-08 DIAGNOSIS — Z87448 Personal history of other diseases of urinary system: Secondary | ICD-10-CM | POA: Insufficient documentation

## 2015-02-08 DIAGNOSIS — Z720 Tobacco use: Secondary | ICD-10-CM | POA: Insufficient documentation

## 2015-02-08 DIAGNOSIS — S79911A Unspecified injury of right hip, initial encounter: Secondary | ICD-10-CM | POA: Insufficient documentation

## 2015-02-08 DIAGNOSIS — Y9389 Activity, other specified: Secondary | ICD-10-CM | POA: Insufficient documentation

## 2015-02-08 MED ORDER — CYCLOBENZAPRINE HCL 10 MG PO TABS
10.0000 mg | ORAL_TABLET | Freq: Once | ORAL | Status: AC
  Administered 2015-02-08: 10 mg via ORAL
  Filled 2015-02-08: qty 1

## 2015-02-08 MED ORDER — CYCLOBENZAPRINE HCL 10 MG PO TABS: 10.0000 mg | ORAL_TABLET | Freq: Two times a day (BID) | ORAL | Status: AC | PRN

## 2015-02-08 MED ORDER — HYDROCODONE-ACETAMINOPHEN 5-325 MG PO TABS: 1.0000 | ORAL_TABLET | ORAL | Status: AC | PRN

## 2015-02-08 MED ORDER — OXYCODONE-ACETAMINOPHEN 5-325 MG PO TABS
1.0000 | ORAL_TABLET | Freq: Once | ORAL | Status: AC
  Administered 2015-02-08: 1 via ORAL
  Filled 2015-02-08: qty 1

## 2015-02-08 NOTE — Discharge Instructions (Signed)
Take the medication as directed. Do not take the narcotic if you are driving as it will make you sleepy. Follow up with Dr. Hilda LiasKeeling if symptoms persist. You may take ibuprofen in addition to the medications we give you.

## 2015-02-08 NOTE — ED Provider Notes (Signed)
CSN: [id number]     Arrival date & time 02/08/15  0908 History   First MD Initiated Contact with Patient 02/08/15 [phone]     Chief Complaint  Patient presents with  . Fall     (Consider location/radiation/quality/duration/timing/severity/associated sxs/prior Treatment) Patient is a 34 y.o. female presenting with fall. The history is provided by the patient.  Fall This is a new problem. The current episode started yesterday. The problem has been gradually worsening. Associated symptoms include arthralgias. The symptoms are aggravated by standing and walking. She has tried acetaminophen, heat, ice and NSAIDs for the symptoms. The treatment provided mild relief.   Michaela Schneider is a 34 y.o. female who presents to the ED with low back, right hip and right knee pain that started yesterday after she slipped and fell on the ice. She is not sure what she hit when she fell. She has been taking ibuprofen, tylenol and BC powder and applying ice and alternating with heat. She did get some relief but continues to have a lot of pain. She has a large bruise to the right thigh.   Past Medical History  Diagnosis Date  . Renal disorder     Kidney stone x 1  multi- Kidney infections  . 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 Cesarean section      three total  . Tubal ligation  06-01-2009    done with last C-section   No family history on file. History  Substance Use Topics  . Smoking status: Current Every Day Smoker -- 0.50 packs/day for 14 years    Types: Cigarettes  . Smokeless tobacco: Not on file  . Alcohol Use: Yes     Comment: if a special occasion   OB History    Gravida Para Term Preterm AB TAB SAB Ectopic Multiple Living   Review of Systems  Musculoskeletal: Positive for back pain and arthralgias.       Right hip and knee pain, right thigh pain.  all  other systems negative    Allergies  Penicillins; Sulfa antibiotics; Ultram; and Erythromycin  Home Medications   Prior to Admission medications   Medication Sig Start Date End Date Taking? Authorizing Provider  Aspirin-Salicylamide-Caffeine (BC HEADACHE PO) Take 1 packet by mouth 2 (two) times daily as needed (for pain).    Yes Historical Provider, MD  ibuprofen (ADVIL,MOTRIN) 600 MG tablet Take 600 mg by mouth every 6 (six) hours as needed for mild pain.   Yes Historical Provider, MD  cyclobenzaprine (FLEXERIL) 10 MG tablet Take 1 tablet (10 mg total) by mouth 2 (two) times daily as needed for muscle spasms. 02/08/15   Esteven Overfelt Orlene Och, NP  HYDROcodone-acetaminophen (NORCO/VICODIN) 5-325 MG per tablet Take 1 tablet by mouth every 4 (four) hours as needed. 02/08/15   Aamori Mcmasters Orlene Och, NP   BP 110/76 mmHg  Pulse 96  Temp(Src) 98.6 F (37 C)  Resp 18  Ht  (1.676 m)  Wt 135 lb (61.236 kg)  BMI 21.80 kg/m2  SpO2 100%  LMP 02/08/2015 Physical Exam  Constitutional: She is oriented to person, place, and time. She appears well-developed and well-nourished. No distress.  HENT:  Head: Normocephalic and atraumatic.  Right Ear: Tympanic membrane normal.  Left Ear: Tympanic membrane normal.  Nose:  Nose normal.  Mouth/Throat: Uvula is midline, oropharynx is clear and moist and mucous membranes are normal.  Eyes: EOM are normal.  Neck: Normal range of motion. Neck supple.  Cardiovascular: Normal rate and regular rhythm.   Pulmonary/Chest: Effort normal. She has no wheezes. She has no rales.  Abdominal: Soft. Bowel sounds are normal. There is no tenderness.  Musculoskeletal: Normal range of motion.       Lumbar back: She exhibits tenderness, pain and spasm. She exhibits normal pulse.  Neurological: She is alert and oriented to person, place, and time. She has normal strength. No cranial nerve deficit or sensory deficit. Gait normal.  Reflex Scores:      Bicep reflexes are 2+ on the right side  and 2+ on the left side.      Brachioradialis reflexes are 2+ on the right side and 2+ on the left side.      Patellar reflexes are 2+ on the right side and 2+ on the left side.      Achilles reflexes are 2+ on the right side and 2+ on the left side. Skin: Skin is warm and dry.  Psychiatric: She has a normal mood and affect. Her behavior is normal.  Nursing note and vitals reviewed.  Dg Lumbar Spine Complete  02/08/2015   CLINICAL DATA:  Fall, injury, slipped on ice, low back pain  EXAM: LUMBAR SPINE - COMPLETE 4+ VIEW  COMPARISON:  None.  FINDINGS: Five views of lumbar spine submitted. No acute fracture or subluxation. Alignment, disc spaces and vertebral body heights are preserved. Post tubal ligation surgical clips are noted within pelvis.  IMPRESSION: Negative.   Electronically Signed   By: Natasha MeadLiviu  Pop M.D.   On: 02/08/2015 11:02   Dg Knee Complete 4 Views Right  02/08/2015   CLINICAL DATA:  Fall, slipped on ice, right knee pain  EXAM: RIGHT KNEE - COMPLETE 4+ VIEW  COMPARISON:  None.  FINDINGS: Four views of the right knee submitted. No acute fracture or subluxation. No radiopaque foreign body. No joint effusion.  IMPRESSION: Negative.   Electronically Signed   By: Natasha MeadLiviu  Pop M.D.   On: 02/08/2015 11:03    ED Course  Procedures  Ace wrap to knee, ice pain management.  MDM  34 y.o. female with low back, right knee and right hip pain s/p fall yesterday. Stable for d/c without neurovascular deficits. I have reviewed this patient's vital signs, nurses notes, appropriate labs and imaging.  I have discussed findings and plan of care with the patient and she voices understanding and agrees with plan.  Final diagnoses:  Knee contusion, right, initial encounter  Lumbar contusion, initial encounter  Fall, initial encounter       [facility] M Rilynn Habel, NP 02/08/15 1900  Benny LennertJoseph L Zammit, MD 02/09/15 [phone]

## 2015-02-08 NOTE — ED Notes (Addendum)
pt c/o lower back and right hip/knee pain since slipping and falling on ice yesterday am. nad noted.

## 2015-02-08 NOTE — Care Management Note (Signed)
ED/CM noted patient did not have health insurance and/or PCP listed in the computer.  Patient was given the [place] resource handout with information on the clinics, food pantries, and the handout for new health insurance sign-up. Pt was also given a Rx discount card. Patient expressed appreciation for information received. 

## 2015-02-08 NOTE — ED Notes (Signed)
Injury to right knee, right hip and lower back.  Rates pain 9.

## 2015-03-16 ENCOUNTER — Telehealth: Payer: Self-pay | Admitting: Family Medicine

## 2015-03-21 NOTE — Telephone Encounter (Signed)
Patient advised that our office will not write for xanax or percocet and she states she will try somewhere else.

## 2015-03-21 NOTE — Telephone Encounter (Signed)
Patient has bcbs she has been on percocet and xanax.

## 2015-05-11 ENCOUNTER — Telehealth: Payer: Self-pay | Admitting: Nurse Practitioner

## 2015-05-11 NOTE — Telephone Encounter (Signed)
Pt would be new pt No urgent care appts available today Instructed to go to urgent care

## 2016-09-01 IMAGING — DX DG KNEE COMPLETE 4+V*R*
4 series · 4 of 4 positions shown · non-contrast
Comparison: None.

CLINICAL DATA: Fall, slipped on ice, right knee pain

EXAM:
RIGHT KNEE - COMPLETE 4+ VIEW

[knee ap]
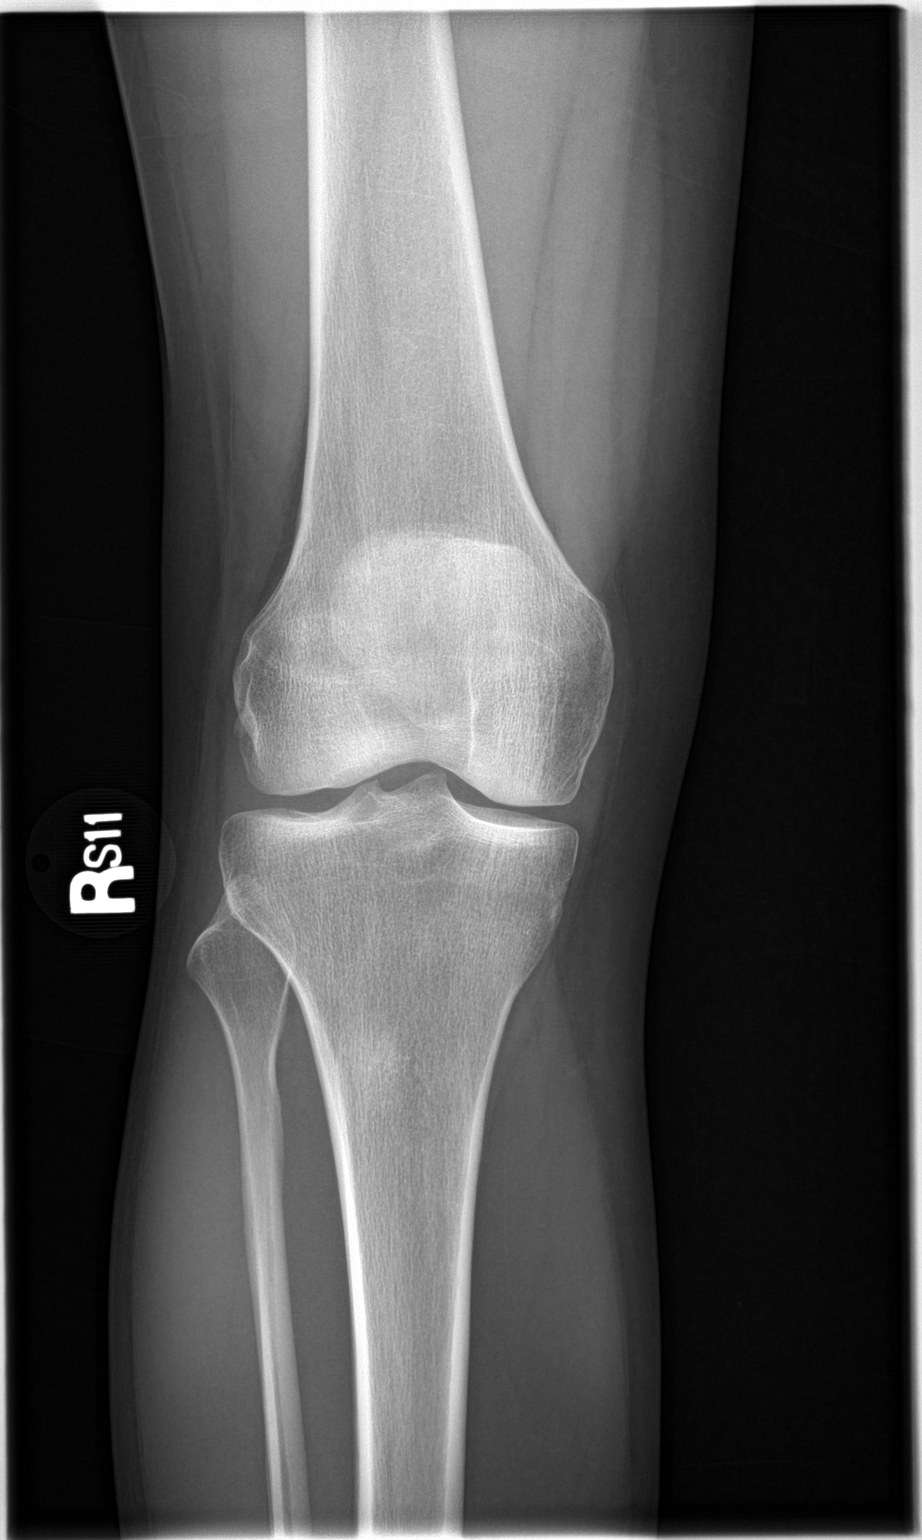

[knee obl (1 of 2)]
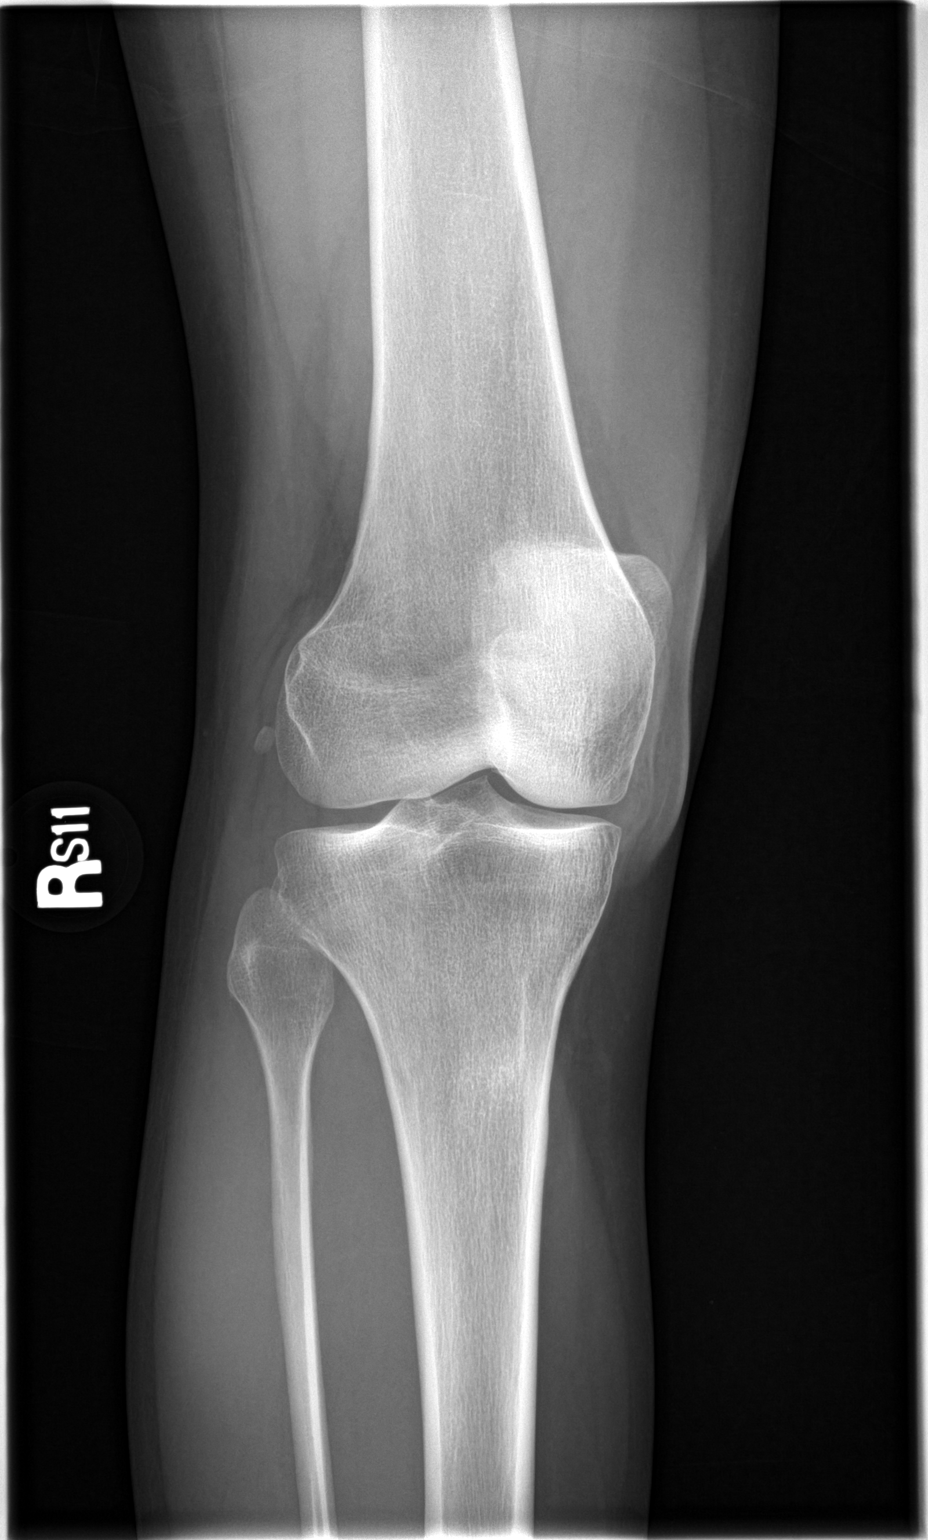

[knee obl (2 of 2)]
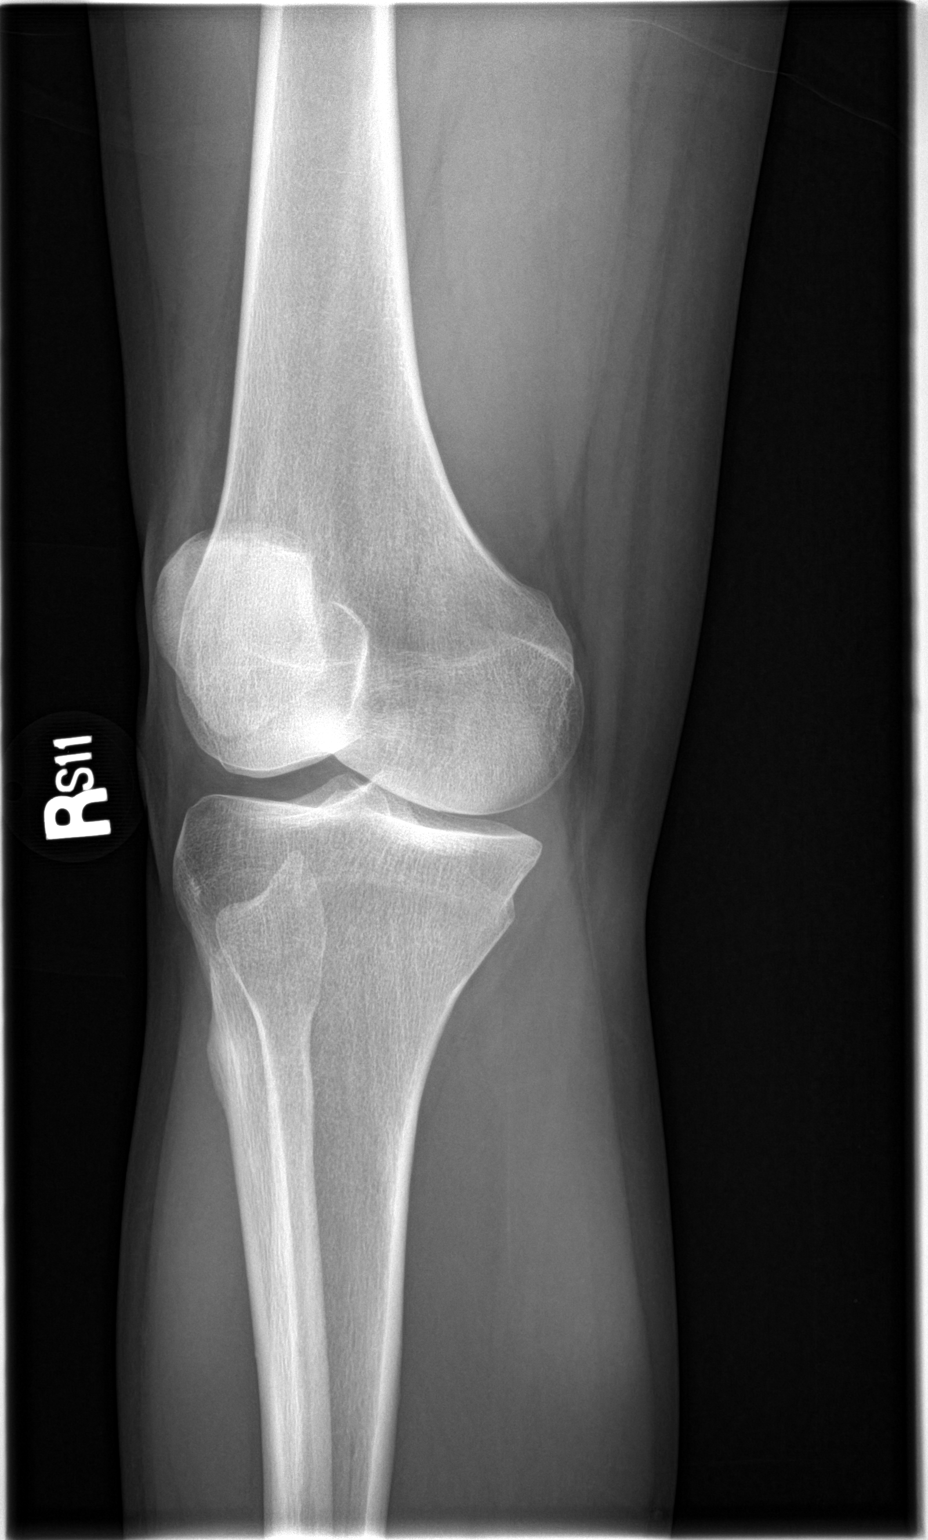

[knee lat]
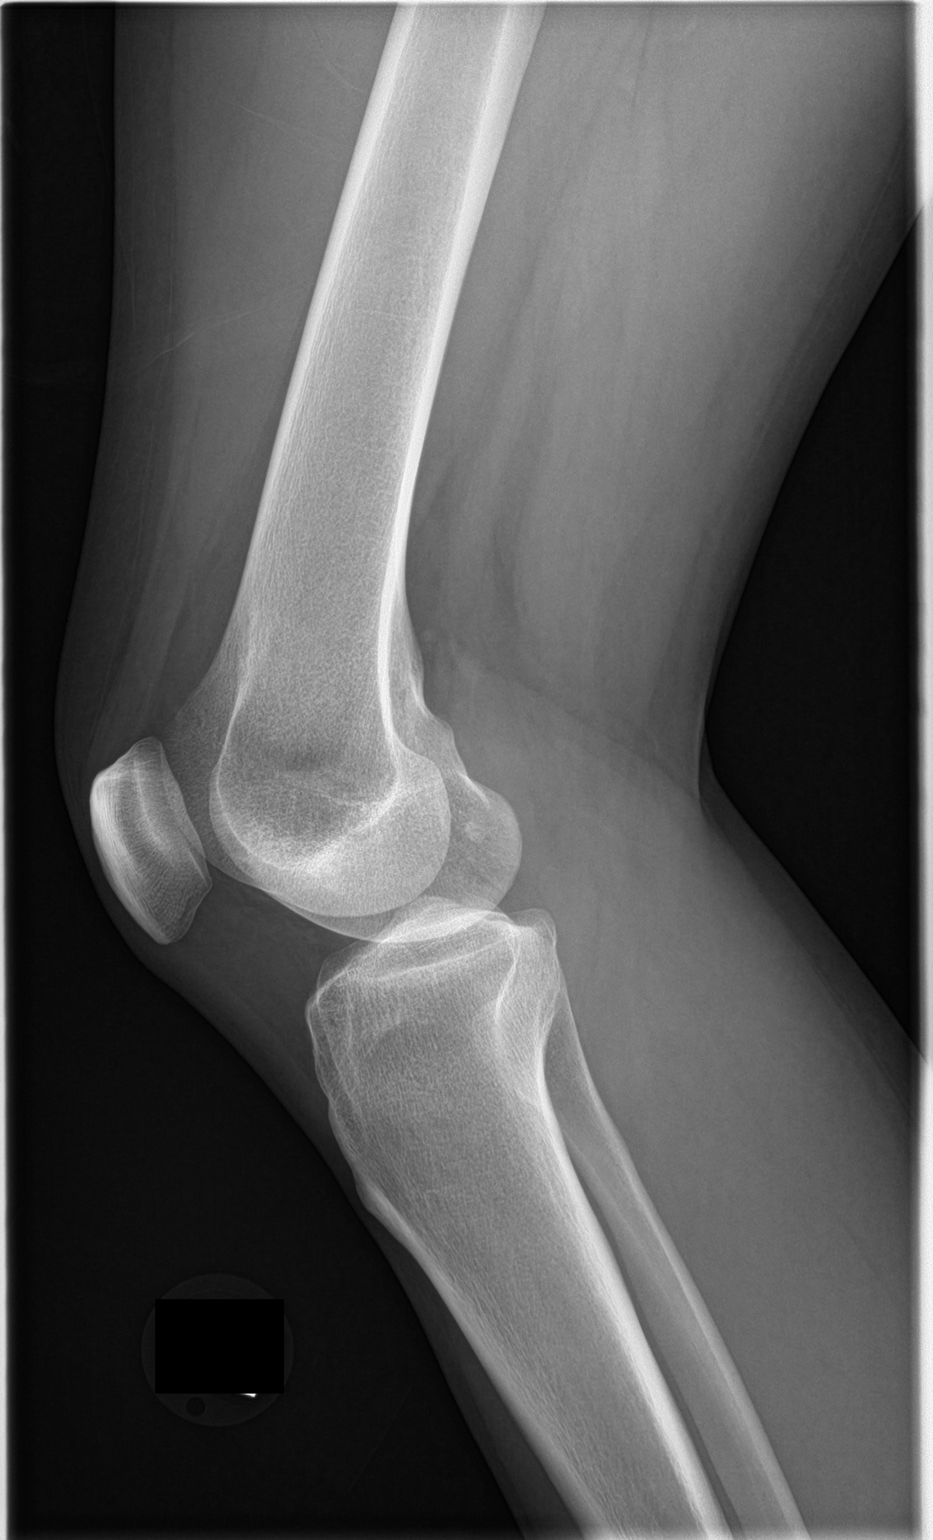

[4 of 4 positions shown; findings below may reference images not displayed]

FINDINGS: Four views of the right knee submitted. No acute fracture or
subluxation. No radiopaque foreign body. No joint effusion.
IMPRESSION: Negative.

## 2017-03-18 ENCOUNTER — Telehealth: Payer: Self-pay | Admitting: Family Medicine

## 2017-03-18 NOTE — Telephone Encounter (Signed)
Pt requesting info on Suboxone [facility] Pt instructed to contact [facility]
